# Patient Record
Sex: Female | Born: 1965 | Race: White | Hispanic: No | Marital: Single | Smoking: Never smoker
Health system: Southern US, Community
[De-identification: ages and names within clinical notes are randomized; demographics above are authoritative.]

---

## 1999-02-06 ENCOUNTER — Other Ambulatory Visit: Admission: RE | Admit: 1999-02-06 | Discharge: 1999-02-06 | Payer: Self-pay | Admitting: Obstetrics and Gynecology

## 2006-11-29 ENCOUNTER — Emergency Department (HOSPITAL_COMMUNITY): Admission: EM | Admit: 2006-11-29 | Discharge: 2006-11-29 | Payer: Self-pay | Admitting: Emergency Medicine

## 2007-02-14 ENCOUNTER — Emergency Department (HOSPITAL_COMMUNITY): Admission: EM | Admit: 2007-02-14 | Discharge: 2007-02-14 | Payer: Self-pay | Admitting: Emergency Medicine

## 2007-11-24 ENCOUNTER — Encounter: Admission: RE | Admit: 2007-11-24 | Discharge: 2007-11-24 | Payer: Self-pay | Admitting: Gastroenterology

## 2016-01-22 ENCOUNTER — Encounter (HOSPITAL_BASED_OUTPATIENT_CLINIC_OR_DEPARTMENT_OTHER): Payer: Self-pay | Admitting: *Deleted

## 2016-01-22 ENCOUNTER — Emergency Department (HOSPITAL_BASED_OUTPATIENT_CLINIC_OR_DEPARTMENT_OTHER)
Admission: EM | Admit: 2016-01-22 | Discharge: 2016-01-22 | Disposition: A | Discharge status: Home / Self Care | Payer: BLUE CROSS/BLUE SHIELD | Attending: Emergency Medicine | Admitting: Emergency Medicine

## 2016-01-22 DIAGNOSIS — T63441A Toxic effect of venom of bees, accidental (unintentional), initial encounter: Secondary | ICD-10-CM | POA: Diagnosis present

## 2016-01-22 MED ORDER — PREDNISONE 50 MG PO TABS
60.0000 mg | ORAL_TABLET | Freq: Once | ORAL | Status: AC
  Administered 2016-01-22: 60 mg via ORAL
  Filled 2016-01-22: qty 1

## 2016-01-22 NOTE — ED Notes (Addendum)
Patient states she has taken approximately 2.5 oz of liquid Benadryl ( 1 tsp = 5 ml ).  In addition, she took 800 mg Ibuprofen per the school nurses recommendation.  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  No past history of bee stings reaction.

## 2016-01-22 NOTE — ED Provider Notes (Signed)
[facility] Provider Note   CSN: [id number] Arrival date & time: 01/22/16  1042     History   Chief Complaint Chief Complaint  Patient presents with  . Insect Bite    HPI Stacy Lester is a 50 y.o. female. She presents after a yellow jacket sting. She is a Print production planner.  She was on the playground. She was leaning against a piece of equipment. She felt a sting on her arm and noticed a yellow jacket fly away. She became concerned because she had "anaphylaxis" to "fire ants". His never had a significant reaction to hymenoptera.  No airway symptoms. No tongue or mouth swelling. No stridor. No wheezing or difficulty breathing. Is not lightheaded or syncopal. Has localized itching and staining to her right forearm. No diffuse urticaria/hives.  Patient drank approximately one or 2 ounces of liquid Benadryl. States she feels "hyper".  HPI  History reviewed. No pertinent past medical history.  There are no active problems to display for this patient.   History reviewed. No pertinent surgical history.  OB History    No data available       Home Medications    Prior to Admission medications   Not on File    Family History No family history on file.  Social History Social History  Substance Use Topics  . Smoking status: Never Smoker  . Smokeless tobacco: Never Used  . Alcohol use Yes     Allergies   Review of patient's allergies indicates no known allergies.   Review of Systems Review of Systems  Constitutional: Negative for appetite change, chills, diaphoresis, fatigue and fever.  HENT: Negative for mouth sores, sore throat and trouble swallowing.   Eyes: Negative for visual disturbance.  Respiratory: Negative for cough, chest tightness, shortness of breath and wheezing.   Cardiovascular: Negative for chest pain.  Gastrointestinal: Negative for abdominal distention, abdominal pain, diarrhea, nausea and vomiting.  Endocrine: Negative for  polydipsia, polyphagia and polyuria.  Genitourinary: Negative for dysuria, frequency and hematuria.  Musculoskeletal: Negative for gait problem.  Skin: Negative for color change, pallor and rash.       Sting to right forearm  Neurological: Negative for dizziness, syncope, light-headedness and headaches.  Hematological: Does not bruise/bleed easily.  Psychiatric/Behavioral: Negative for behavioral problems and confusion.     Physical Exam Updated Vital Signs BP 154/76 (BP Location: Right Arm)   Pulse 96   Temp 98.3 F (36.8 C) (Oral)   Resp 22   Ht 5\' 3"  (1.6 m)   Wt 112 lb (50.8 kg)   LMP 01/05/2016   SpO2 100%   BMI 19.84 kg/m   Physical Exam  Constitutional: She is oriented to person, place, and time. She appears well-developed and well-nourished. No distress.  HENT:  Head: Normocephalic.  Normal lips and pharynx. Normal tongue. Uvula midline. No stridor. No wheezing.  Eyes: Conjunctivae are normal. Pupils are equal, round, and reactive to light. No scleral icterus.  Neck: Normal range of motion. Neck supple. No thyromegaly present.  Cardiovascular: Normal rate and regular rhythm.  Exam reveals no gallop and no friction rub.   No murmur heard. Pulmonary/Chest: Effort normal and breath sounds normal. No respiratory distress. She has no wheezes. She has no rales.   clear bilateral breath sounds. Not tachycardic.  Abdominal: Soft. Bowel sounds are normal. She exhibits no distension. There is no tenderness. There is no rebound.  Musculoskeletal: Normal range of motion.  Neurological: She is alert and oriented to person, place,  and time.  Skin: Skin is warm and dry. No rash noted.  Area of the right forearm about the size of a silver dollar. Central white elevated lesion consistent with sting and some minimal surrounding erythema. No diffuse urticaria or hives.  Psychiatric: She has a normal mood and affect. Her behavior is normal.   patient is not confused or tachycardic are  dry mouthed.  Does not appear anticholinergic.   ED Treatments / Results  Labs (all labs ordered are listed, but only abnormal results are displayed) Labs Reviewed - No data to display  EKG  EKG Interpretation None       Radiology No results found.  Procedures Procedures (including critical care time)  Medications Ordered in ED Medications  predniSONE (DELTASONE) tablet 60 mg (60 mg Oral Given 01/22/16 1157)     Initial Impression / Assessment and Plan / ED Course  I have reviewed the triage vital signs and the nursing notes.  Pertinent labs & imaging results that were available during my care of the patient were reviewed by me and considered in my medical decision making (see chart for details).  Clinical Course    Reassured. No signs of systemic reaction. Given single-dose prednisone to hopefully help curb her local reaction. Opposition should not take additional Benadryl arrested a periods observed here 2 hours no additional symptoms. Appropriate for discharge home.  Final Clinical Impressions(s) / ED Diagnoses   Final diagnoses:  Bee sting, accidental or unintentional, initial encounter    New Prescriptions New Prescriptions   No medications on file     Tanna Furry, MD 01/22/16 1257

## 2016-01-22 NOTE — ED Triage Notes (Signed)
Bee sting to her right arm 30 minutes ago. She took Benadryl. No respiratory distress.

## 2016-01-22 NOTE — Discharge Instructions (Signed)
No additional benadryl today.  Ice/cool compress to area  Return to ER with any additional symptoms.

## 2016-01-22 NOTE — ED Notes (Signed)
Pt came running out in the hall stating "im going to pass out I need help." pt vitals taken. VS WNL. RN made aware.

## 2016-05-19 ENCOUNTER — Other Ambulatory Visit: Payer: Self-pay | Admitting: Physician Assistant

## 2016-05-19 DIAGNOSIS — R102 Pelvic and perineal pain: Secondary | ICD-10-CM

## 2016-05-19 DIAGNOSIS — R1030 Lower abdominal pain, unspecified: Secondary | ICD-10-CM

## 2016-05-20 ENCOUNTER — Ambulatory Visit (HOSPITAL_COMMUNITY)
Admission: RE | Admit: 2016-05-20 | Discharge: 2016-05-20 | Disposition: A | Discharge status: Home / Self Care | Payer: BLUE CROSS/BLUE SHIELD | Source: Ambulatory Visit | Attending: Physician Assistant | Admitting: Physician Assistant

## 2016-05-20 ENCOUNTER — Ambulatory Visit (HOSPITAL_COMMUNITY): Admission: RE | Admit: 2016-05-20 | Payer: BLUE CROSS/BLUE SHIELD | Source: Ambulatory Visit

## 2016-05-20 DIAGNOSIS — R102 Pelvic and perineal pain: Secondary | ICD-10-CM | POA: Diagnosis present

## 2016-05-20 DIAGNOSIS — D259 Leiomyoma of uterus, unspecified: Secondary | ICD-10-CM | POA: Diagnosis not present

## 2016-05-20 DIAGNOSIS — R1030 Lower abdominal pain, unspecified: Secondary | ICD-10-CM | POA: Diagnosis present

## 2016-05-20 DIAGNOSIS — N83202 Unspecified ovarian cyst, left side: Secondary | ICD-10-CM | POA: Insufficient documentation

## 2017-01-03 ENCOUNTER — Ambulatory Visit
Admission: RE | Admit: 2017-01-03 | Discharge: 2017-01-03 | Disposition: A | Discharge status: Home / Self Care | Payer: BLUE CROSS/BLUE SHIELD | Source: Ambulatory Visit | Attending: Family Medicine | Admitting: Family Medicine

## 2017-01-03 ENCOUNTER — Other Ambulatory Visit: Payer: Self-pay | Admitting: Family Medicine

## 2017-01-03 DIAGNOSIS — M25512 Pain in left shoulder: Secondary | ICD-10-CM

## 2017-02-28 ENCOUNTER — Other Ambulatory Visit: Payer: Self-pay | Admitting: Family Medicine

## 2017-02-28 DIAGNOSIS — M25512 Pain in left shoulder: Secondary | ICD-10-CM

## 2017-03-05 ENCOUNTER — Other Ambulatory Visit: Payer: BLUE CROSS/BLUE SHIELD

## 2019-02-16 ENCOUNTER — Other Ambulatory Visit: Payer: Self-pay

## 2019-02-16 ENCOUNTER — Other Ambulatory Visit: Payer: Self-pay | Admitting: Family Medicine

## 2019-02-16 ENCOUNTER — Ambulatory Visit
Admission: RE | Admit: 2019-02-16 | Discharge: 2019-02-16 | Disposition: A | Discharge status: Home / Self Care | Payer: BLUE CROSS/BLUE SHIELD | Source: Ambulatory Visit | Attending: Family Medicine | Admitting: Family Medicine

## 2019-02-16 DIAGNOSIS — M79672 Pain in left foot: Secondary | ICD-10-CM

## 2019-02-16 DIAGNOSIS — S9032XA Contusion of left foot, initial encounter: Secondary | ICD-10-CM

## 2019-11-10 ENCOUNTER — Encounter (HOSPITAL_BASED_OUTPATIENT_CLINIC_OR_DEPARTMENT_OTHER): Payer: Self-pay | Admitting: Emergency Medicine

## 2019-11-10 ENCOUNTER — Emergency Department (HOSPITAL_BASED_OUTPATIENT_CLINIC_OR_DEPARTMENT_OTHER)
Admission: EM | Admit: 2019-11-10 | Discharge: 2019-11-10 | Disposition: A | Discharge status: Home / Self Care | Payer: BLUE CROSS/BLUE SHIELD | Attending: Emergency Medicine | Admitting: Emergency Medicine

## 2019-11-10 DIAGNOSIS — T7840XA Allergy, unspecified, initial encounter: Secondary | ICD-10-CM | POA: Diagnosis not present

## 2019-11-10 DIAGNOSIS — L509 Urticaria, unspecified: Secondary | ICD-10-CM | POA: Insufficient documentation

## 2019-11-10 MED ORDER — DEXAMETHASONE SODIUM PHOSPHATE 10 MG/ML IJ SOLN
10.0000 mg | Freq: Once | INTRAMUSCULAR | Status: AC
  Administered 2019-11-10: 10 mg via INTRAVENOUS
  Filled 2019-11-10: qty 1

## 2019-11-10 MED ORDER — PREDNISONE 50 MG PO TABS: 50.0000 mg | ORAL_TABLET | Freq: Every day | ORAL | 0 refills | Status: AC

## 2019-11-10 MED ORDER — FAMOTIDINE IN NACL 20-0.9 MG/50ML-% IV SOLN
20.0000 mg | Freq: Once | INTRAVENOUS | Status: AC
  Administered 2019-11-10: 20 mg via INTRAVENOUS
  Filled 2019-11-10: qty 50

## 2019-11-10 MED ORDER — EPINEPHRINE 0.3 MG/0.3ML IJ SOAJ: 0.3000 mg | INTRAMUSCULAR | 0 refills | Status: AC | PRN

## 2019-11-10 NOTE — ED Notes (Signed)
ED Provider at bedside. 

## 2019-11-10 NOTE — Discharge Instructions (Signed)
Take an over-the-counter antihistamine such as Benadryl or Claritin for the next week.  Take the steroids as prescribed.  Carry the EpiPen with you in case you have any recurrent reaction.  Consider following up with an allergist for further testing

## 2019-11-10 NOTE — ED Triage Notes (Signed)
At grocery store at 1330, felt tongue swell and hives. Took oral benadryl at home large liquid amount. Hives on arms and leges, airway intact. No angio edema. Pt states hx of anaplaxis to ant bites. Pt unsure of exposure. Alert, talkative, NAD.

## 2019-11-10 NOTE — ED Notes (Signed)
Pt discharged to home. Discharge instructions have been discussed with patient and/or family members. Pt verbally acknowledges understanding d/c instructions, and endorses comprehension to checkout at registration before leaving.  °

## 2019-11-10 NOTE — ED Provider Notes (Signed)
[facility] EMERGENCY DEPARTMENT Provider Note   CSN: [id number] Arrival date & time: 11/10/19  1444     History Chief Complaint  Patient presents with  . Allergic Reaction    Stacy Lester is a 54 y.o. female.  HPI   Patient presented to the emergency room for evaluation of allergic reaction.  Patient states she had taken her dog for a walk but today but does not recall being stung or bitten by anything.  However, this afternoon she started having a sensation of itching and she developed a rash.  Patient also started to feel like her tongue and lips were becoming swollen.  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  She does carry an EpiPen with her but did not feel that was necessary.  She did take a 2 large swig of Benadryl.  She drank half a bottle and estimated it might have been around 30 cc.  The Benadryl was 12.5 mg per 5 cc.  Patient went to an urgent care and they suggested she come to the ED.  Patient does feel like her breathing is improving.  She is able to speak clearly and is able to drink without difficulty  History reviewed. No pertinent past medical history.  There are no problems to display for this patient.   History reviewed. No pertinent surgical history.   OB History   No obstetric history on file.     No family history on file.  Social History   Tobacco Use  . Smoking status: Never Smoker  . Smokeless tobacco: Never Used  Substance Use Topics  . Alcohol use: Yes  . Drug use: No    Home Medications Prior to Admission medications   Medication Sig Start Date End Date Taking? Authorizing Provider  EPINEPHrine 0.3 mg/0.3 mL IJ SOAJ injection Inject 0.3 mLs (0.3 mg total) into the muscle as needed for anaphylaxis. 11/10/19   Dorie Rank, MD  predniSONE (DELTASONE) 50 MG tablet Take 1 tablet (50 mg total) by mouth daily. 11/10/19   Dorie Rank, MD    Allergies    Codeine  Review of Systems   Review of Systems  All other  systems reviewed and are negative.   Physical Exam Updated Vital Signs BP 124/73   Pulse 74   Temp 98.5 F (36.9 C) (Oral)   Resp 16   SpO2 100%   Physical Exam Vitals and nursing note reviewed.  Constitutional:      General: She is not in acute distress.    Appearance: She is well-developed.  HENT:     Head: Normocephalic and atraumatic.     Right Ear: External ear normal.     Left Ear: External ear normal.     Mouth/Throat:     Pharynx: No pharyngeal swelling, oropharyngeal exudate, posterior oropharyngeal erythema or uvula swelling.     Comments: Handling secretions without difficulty, able to speak in full sentences Eyes:     General: No scleral icterus.       Right eye: No discharge.        Left eye: No discharge.     Conjunctiva/sclera: Conjunctivae normal.  Neck:     Trachea: No tracheal deviation.  Cardiovascular:     Rate and Rhythm: Normal rate and regular rhythm.  Pulmonary:     Effort: Pulmonary effort is normal. No respiratory distress.     Breath sounds: Normal breath sounds. No stridor. No wheezing or rales.     Comments: No  stridor or wheezing Abdominal:     General: Bowel sounds are normal. There is no distension.     Palpations: Abdomen is soft.     Tenderness: There is no abdominal tenderness. There is no guarding or rebound.  Musculoskeletal:        General: No tenderness.     Cervical back: Neck supple.  Skin:    General: Skin is warm and dry.     Findings: Rash present. Rash is urticarial.  Neurological:     Mental Status: She is alert.     Cranial Nerves: No cranial nerve deficit (no facial droop, extraocular movements intact, no slurred speech).     Sensory: No sensory deficit.     Motor: No abnormal muscle tone or seizure activity.     Coordination: Coordination normal.     ED Results / Procedures / Treatments   Labs (all labs ordered are listed, but only abnormal results are displayed) Labs Reviewed - No data to display  EKG EKG  Interpretation  Date/Time:  Saturday November 10 2019 14:48:56 EDT Ventricular Rate:  92 PR Interval:  124 QRS Duration: 88 QT Interval:  360 QTC Calculation: 445 R Axis:   74 Text Interpretation: Normal sinus rhythm Biatrial enlargement Nonspecific ST abnormality Abnormal ECG No old tracing to compare Confirmed by Sherwood Gambler [phone]) on 11/10/2019 2:55:21 PM   Radiology No results found.  Procedures Procedures (including critical care time)  Medications Ordered in ED Medications  dexamethasone (DECADRON) injection 10 mg (10 mg Intravenous Given 11/10/19 1525)  famotidine (PEPCID) IVPB 20 mg premix ( Intravenous Stopped 11/10/19 1557)    ED Course  I have reviewed the triage vital signs and the nursing notes.  Pertinent labs & imaging results that were available during my care of the patient were reviewed by me and considered in my medical decision making (see chart for details).    MDM Rules/Calculators/A&P                          Pt presented with an allergic reaction.  Pt had urticaria on exam. No sign of airway involvement on exam although pt did have sx earlier.  Cause of the reaction unclear.  Pt does have history of allergy to fire ants but no known sting today.  Will dc home with steroids, epipen, continue antihistamine.  Outpt allergist follow up Final Clinical Impression(s) / ED Diagnoses Final diagnoses:  Allergic reaction, initial encounter  Urticaria    Rx / DC Orders ED Discharge Orders         Ordered    predniSONE (DELTASONE) 50 MG tablet  Daily     Discontinue  Reprint     11/10/19 1659    EPINEPHrine 0.3 mg/0.3 mL IJ SOAJ injection  As needed     Discontinue  Reprint     11/10/19 1659           Dorie Rank, MD 11/10/19 1701

## 2021-02-09 IMAGING — CR DG FOOT COMPLETE 3+V*L*
3 series · 3 of 3 positions shown · non-contrast
Comparison: None.

CLINICAL DATA: Contusion of left foot with pain.

EXAM:
LEFT FOOT - COMPLETE 3+ VIEW

[x foot ap left]
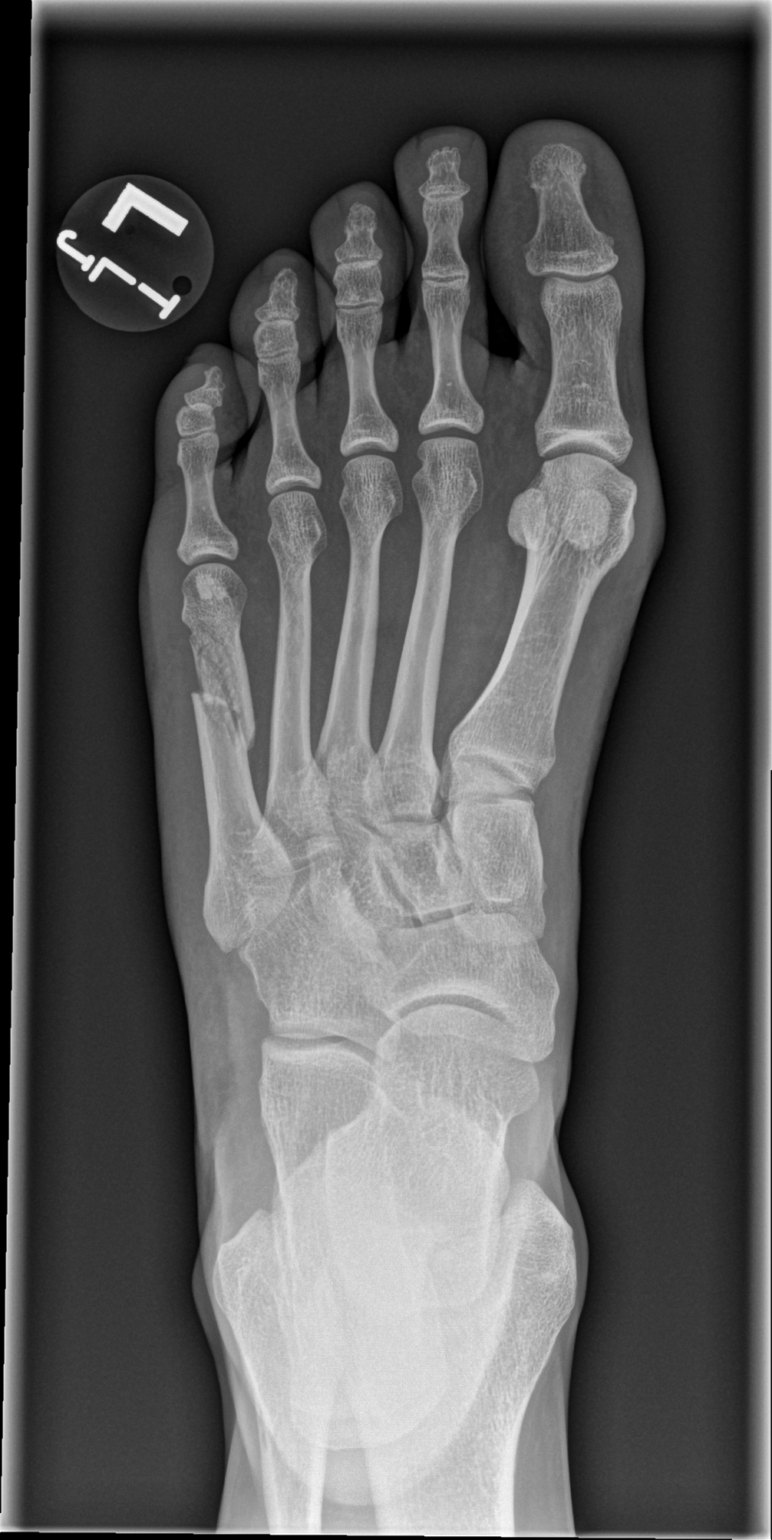

[x foot obl left]
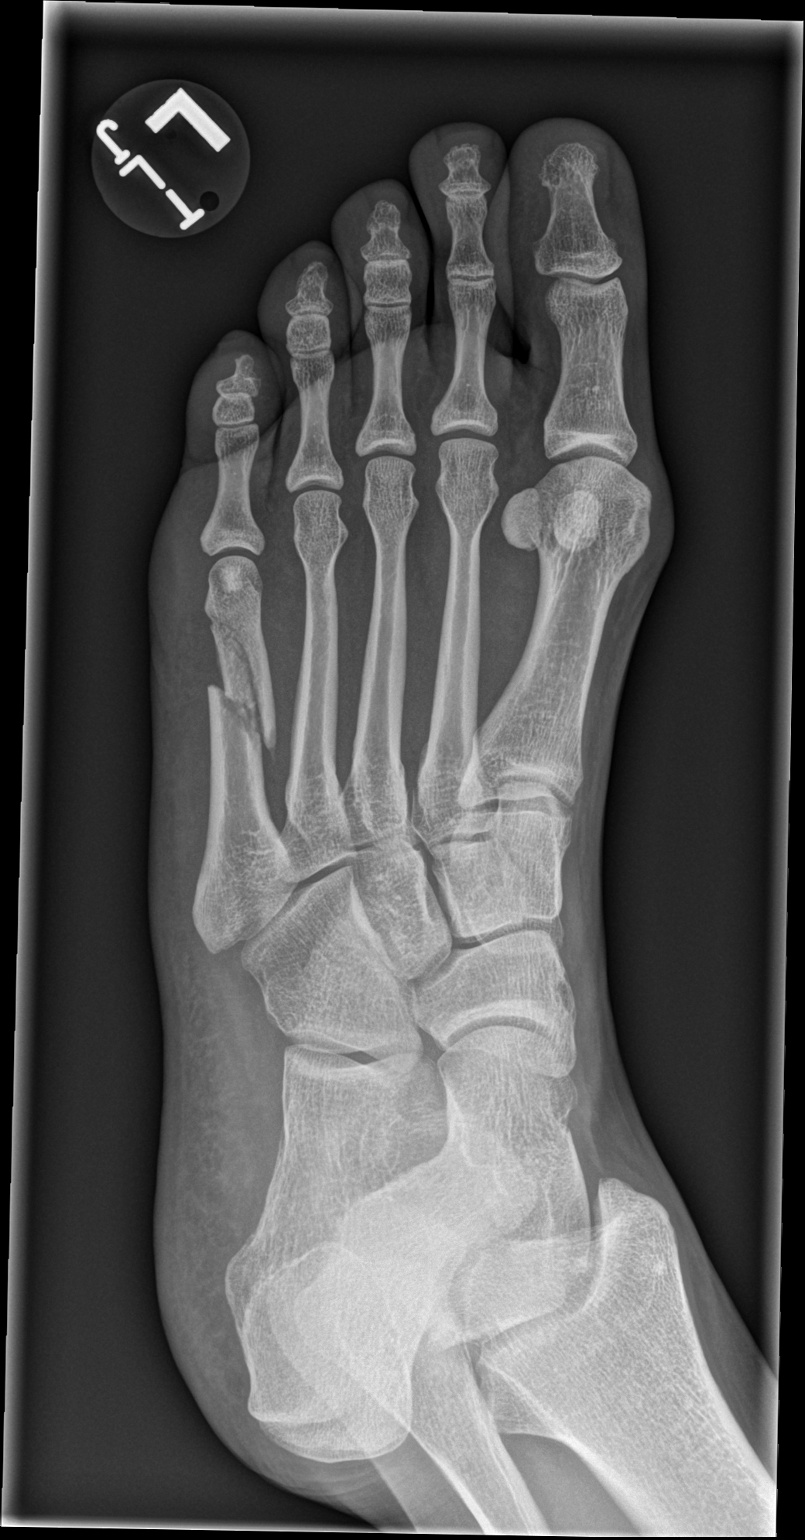

[x foot lat left]
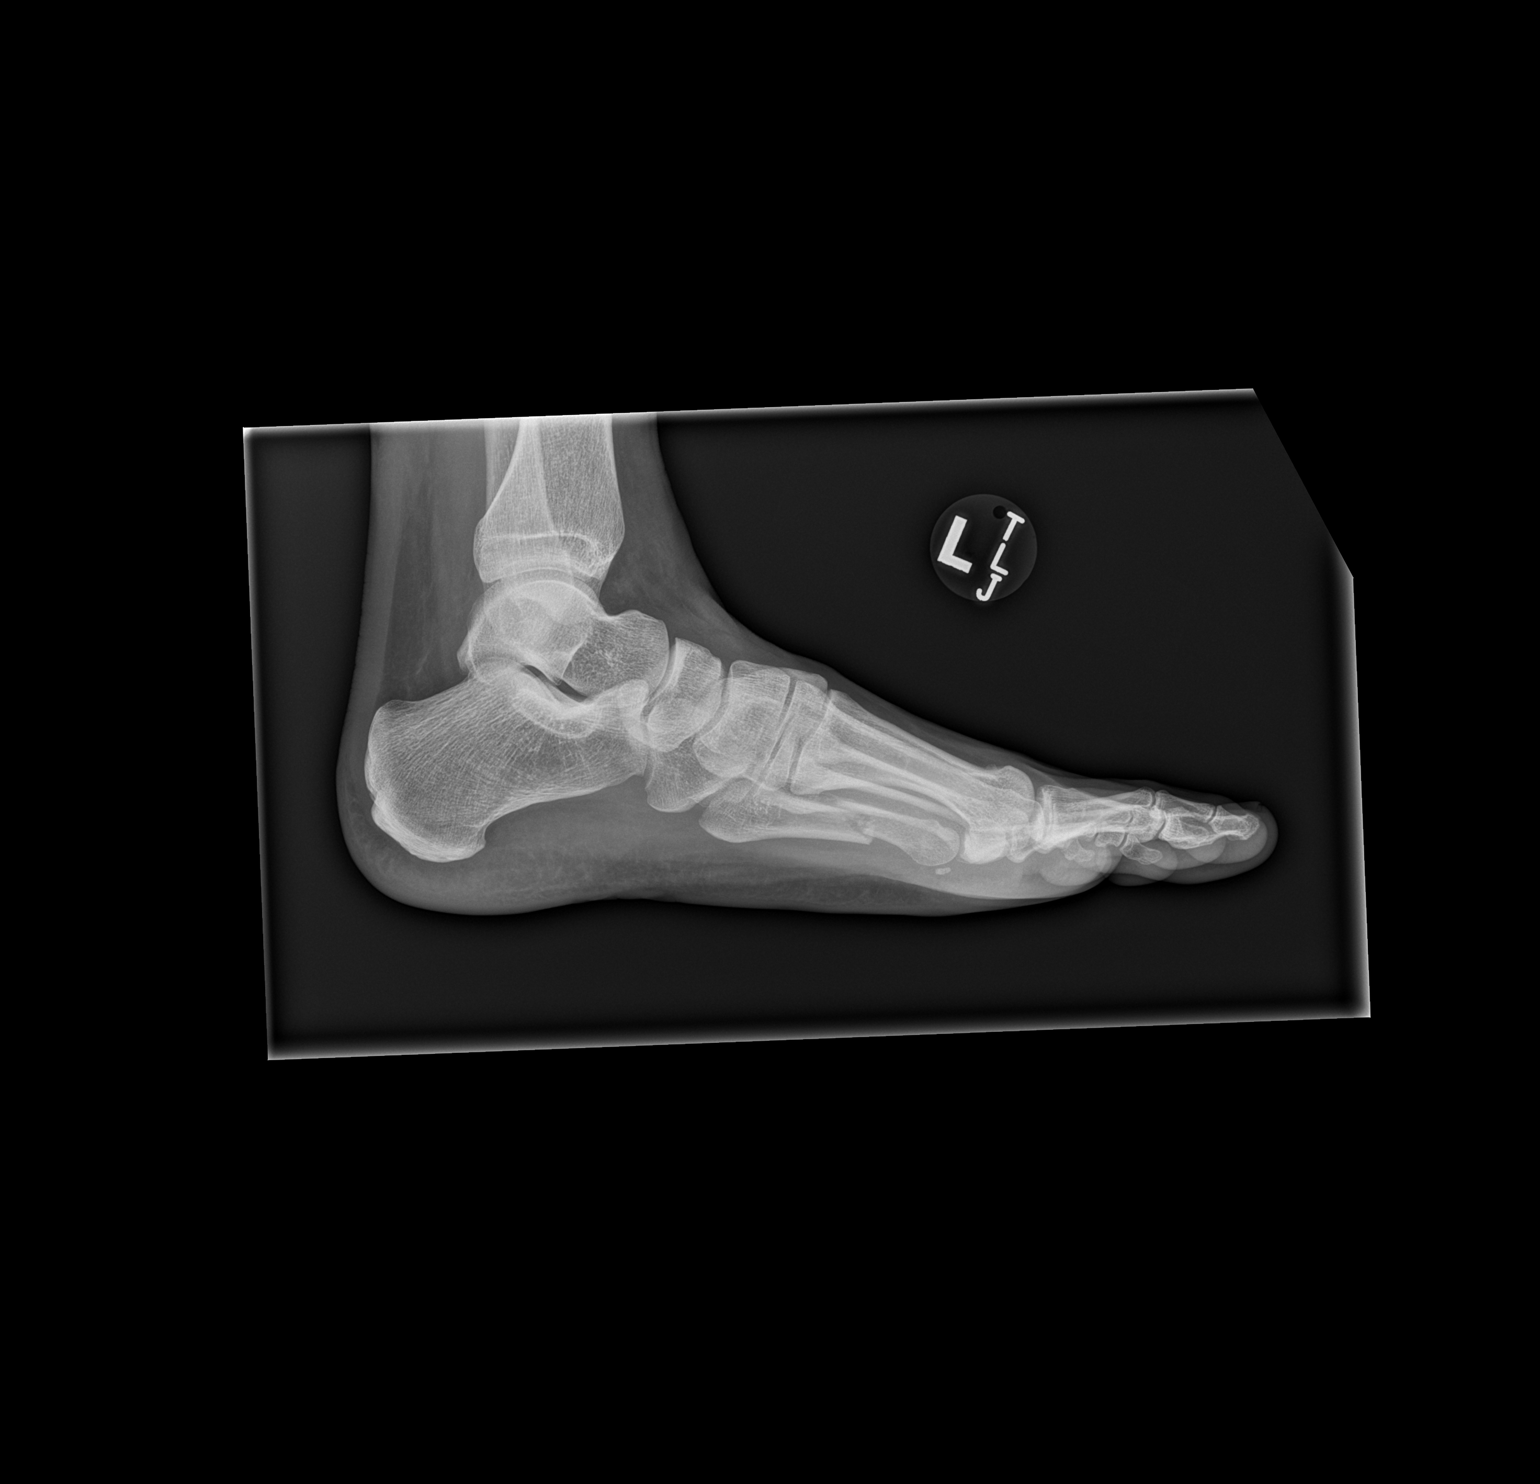

[3 of 3 positions shown; findings below may reference images not displayed]

FINDINGS: Moderately displaced fracture is seen involving the fifth
metatarsal. No other bony abnormality is noted. Joint spaces are
intact. No soft tissue abnormality is noted.
IMPRESSION: Moderately displaced fifth metatarsal fracture.
